# Patient Record
Sex: Male | Born: 1955 | Race: White | Hispanic: No | Marital: Single | Smoking: Former smoker
Health system: Southern US, Community
[De-identification: ages and names within clinical notes are randomized; demographics above are authoritative.]

## PROBLEM LIST (undated history)

## (undated) DIAGNOSIS — R569 Unspecified convulsions: Secondary | ICD-10-CM

## (undated) DIAGNOSIS — I10 Essential (primary) hypertension: Secondary | ICD-10-CM

## (undated) DIAGNOSIS — F10239 Alcohol dependence with withdrawal, unspecified: Secondary | ICD-10-CM

## (undated) DIAGNOSIS — F1010 Alcohol abuse, uncomplicated: Secondary | ICD-10-CM

## (undated) DIAGNOSIS — G4730 Sleep apnea, unspecified: Secondary | ICD-10-CM

## (undated) HISTORY — PX: KIDNEY SURGERY: SHX687

## (undated) HISTORY — PX: SPLENECTOMY: SUR1306

---

## 2016-11-20 ENCOUNTER — Encounter (HOSPITAL_COMMUNITY): Payer: Self-pay | Admitting: *Deleted

## 2016-11-20 ENCOUNTER — Emergency Department (HOSPITAL_COMMUNITY): Payer: No Typology Code available for payment source

## 2016-11-20 ENCOUNTER — Observation Stay (HOSPITAL_COMMUNITY)
Admission: EM | Admit: 2016-11-20 | Discharge: 2016-11-22 | Disposition: A | Discharge status: Home / Self Care | Payer: No Typology Code available for payment source | Attending: Internal Medicine | Admitting: Internal Medicine

## 2016-11-20 DIAGNOSIS — F10239 Alcohol dependence with withdrawal, unspecified: Secondary | ICD-10-CM | POA: Diagnosis not present

## 2016-11-20 DIAGNOSIS — I5031 Acute diastolic (congestive) heart failure: Secondary | ICD-10-CM | POA: Insufficient documentation

## 2016-11-20 DIAGNOSIS — R410 Disorientation, unspecified: Secondary | ICD-10-CM | POA: Diagnosis present

## 2016-11-20 DIAGNOSIS — Z789 Other specified health status: Secondary | ICD-10-CM | POA: Diagnosis present

## 2016-11-20 DIAGNOSIS — G4733 Obstructive sleep apnea (adult) (pediatric): Secondary | ICD-10-CM | POA: Diagnosis present

## 2016-11-20 DIAGNOSIS — Z9119 Patient's noncompliance with other medical treatment and regimen: Secondary | ICD-10-CM | POA: Diagnosis not present

## 2016-11-20 DIAGNOSIS — R55 Syncope and collapse: Principal | ICD-10-CM | POA: Insufficient documentation

## 2016-11-20 DIAGNOSIS — D72828 Other elevated white blood cell count: Secondary | ICD-10-CM | POA: Insufficient documentation

## 2016-11-20 DIAGNOSIS — F1010 Alcohol abuse, uncomplicated: Secondary | ICD-10-CM | POA: Diagnosis present

## 2016-11-20 DIAGNOSIS — F1090 Alcohol use, unspecified, uncomplicated: Secondary | ICD-10-CM | POA: Diagnosis present

## 2016-11-20 DIAGNOSIS — R413 Other amnesia: Secondary | ICD-10-CM | POA: Diagnosis not present

## 2016-11-20 DIAGNOSIS — D72829 Elevated white blood cell count, unspecified: Secondary | ICD-10-CM | POA: Diagnosis present

## 2016-11-20 DIAGNOSIS — I160 Hypertensive urgency: Secondary | ICD-10-CM | POA: Diagnosis not present

## 2016-11-20 DIAGNOSIS — I110 Hypertensive heart disease with heart failure: Secondary | ICD-10-CM | POA: Diagnosis not present

## 2016-11-20 DIAGNOSIS — I10 Essential (primary) hypertension: Secondary | ICD-10-CM | POA: Diagnosis not present

## 2016-11-20 DIAGNOSIS — Z7289 Other problems related to lifestyle: Secondary | ICD-10-CM | POA: Diagnosis present

## 2016-11-20 DIAGNOSIS — E876 Hypokalemia: Secondary | ICD-10-CM | POA: Insufficient documentation

## 2016-11-20 DIAGNOSIS — Z9114 Patient's other noncompliance with medication regimen: Secondary | ICD-10-CM | POA: Diagnosis not present

## 2016-11-20 DIAGNOSIS — F10939 Alcohol use, unspecified with withdrawal, unspecified: Secondary | ICD-10-CM

## 2016-11-20 DIAGNOSIS — R569 Unspecified convulsions: Secondary | ICD-10-CM

## 2016-11-20 DIAGNOSIS — R4020 Unspecified coma: Secondary | ICD-10-CM | POA: Diagnosis present

## 2016-11-20 HISTORY — DX: Sleep apnea, unspecified: G47.30

## 2016-11-20 HISTORY — DX: Essential (primary) hypertension: I10

## 2016-11-20 HISTORY — DX: Alcohol abuse, uncomplicated: F10.10

## 2016-11-20 HISTORY — DX: Unspecified convulsions: R56.9

## 2016-11-20 HISTORY — DX: Alcohol dependence with withdrawal, unspecified: F10.239

## 2016-11-20 LAB — CBC WITH DIFFERENTIAL/PLATELET
BASOS ABS: 0 10*3/uL (ref 0.0–0.1)
BASOS PCT: 0 %
EOS PCT: 2 %
Eosinophils Absolute: 0.3 10*3/uL (ref 0.0–0.7)
HCT: 45.2 % (ref 39.0–52.0)
Hemoglobin: 15.5 g/dL (ref 13.0–17.0)
Lymphocytes Relative: 29 %
Lymphs Abs: 4.1 10*3/uL — ABNORMAL HIGH (ref 0.7–4.0)
MCH: 32 pg (ref 26.0–34.0)
MCHC: 34.3 g/dL (ref 30.0–36.0)
MCV: 93.2 fL (ref 78.0–100.0)
MONO ABS: 1.6 10*3/uL — AB (ref 0.1–1.0)
Monocytes Relative: 11 %
Neutro Abs: 8.3 10*3/uL — ABNORMAL HIGH (ref 1.7–7.7)
Neutrophils Relative %: 58 %
PLATELETS: 353 10*3/uL (ref 150–400)
RBC: 4.85 MIL/uL (ref 4.22–5.81)
RDW: 13.6 % (ref 11.5–15.5)
WBC: 14.2 10*3/uL — ABNORMAL HIGH (ref 4.0–10.5)

## 2016-11-20 LAB — BASIC METABOLIC PANEL
ANION GAP: 10 (ref 5–15)
BUN: 13 mg/dL (ref 6–20)
CALCIUM: 8.7 mg/dL — AB (ref 8.9–10.3)
CO2: 23 mmol/L (ref 22–32)
Chloride: 103 mmol/L (ref 101–111)
Creatinine, Ser: 1.05 mg/dL (ref 0.61–1.24)
Glucose, Bld: 117 mg/dL — ABNORMAL HIGH (ref 65–99)
Potassium: 3.3 mmol/L — ABNORMAL LOW (ref 3.5–5.1)
SODIUM: 136 mmol/L (ref 135–145)

## 2016-11-20 MED ORDER — HYDRALAZINE HCL 20 MG/ML IJ SOLN
5.0000 mg | Freq: Once | INTRAMUSCULAR | Status: AC
  Administered 2016-11-20: 5 mg via INTRAVENOUS
  Filled 2016-11-20: qty 1

## 2016-11-20 MED ORDER — LORAZEPAM 1 MG PO TABS
0.0000 mg | ORAL_TABLET | Freq: Two times a day (BID) | ORAL | Status: DC
Start: 2016-11-23 — End: 2016-11-21

## 2016-11-20 MED ORDER — THIAMINE HCL 100 MG/ML IJ SOLN: 100.0000 mg | Freq: Every day | INTRAMUSCULAR | Status: DC

## 2016-11-20 MED ORDER — LORAZEPAM 2 MG/ML IJ SOLN: 0.0000 mg | Freq: Four times a day (QID) | INTRAMUSCULAR | Status: DC

## 2016-11-20 MED ORDER — LORAZEPAM 2 MG/ML IJ SOLN: 0.0000 mg | Freq: Two times a day (BID) | INTRAMUSCULAR | Status: DC

## 2016-11-20 MED ORDER — LORAZEPAM 1 MG PO TABS
0.0000 mg | ORAL_TABLET | Freq: Four times a day (QID) | ORAL | Status: DC
  Administered 2016-11-21: 1 mg via ORAL
  Filled 2016-11-20: qty 1

## 2016-11-20 MED ORDER — VITAMIN B-1 100 MG PO TABS: 100.0000 mg | ORAL_TABLET | Freq: Every day | ORAL | Status: DC
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
## 2016-11-21 ENCOUNTER — Encounter (HOSPITAL_COMMUNITY): Payer: Self-pay | Admitting: Family Medicine

## 2016-11-21 ENCOUNTER — Observation Stay (HOSPITAL_COMMUNITY): Payer: No Typology Code available for payment source

## 2016-11-21 ENCOUNTER — Observation Stay (HOSPITAL_COMMUNITY)
Admit: 2016-11-21 | Discharge: 2016-11-21 | Disposition: A | Discharge status: Home / Self Care | Payer: No Typology Code available for payment source | Attending: Family Medicine | Admitting: Family Medicine

## 2016-11-21 ENCOUNTER — Observation Stay (HOSPITAL_BASED_OUTPATIENT_CLINIC_OR_DEPARTMENT_OTHER): Payer: No Typology Code available for payment source

## 2016-11-21 DIAGNOSIS — G9340 Encephalopathy, unspecified: Secondary | ICD-10-CM | POA: Insufficient documentation

## 2016-11-21 DIAGNOSIS — Z7289 Other problems related to lifestyle: Secondary | ICD-10-CM | POA: Diagnosis present

## 2016-11-21 DIAGNOSIS — D72829 Elevated white blood cell count, unspecified: Secondary | ICD-10-CM | POA: Diagnosis present

## 2016-11-21 DIAGNOSIS — R410 Disorientation, unspecified: Secondary | ICD-10-CM

## 2016-11-21 DIAGNOSIS — R413 Other amnesia: Secondary | ICD-10-CM

## 2016-11-21 DIAGNOSIS — I160 Hypertensive urgency: Secondary | ICD-10-CM | POA: Diagnosis present

## 2016-11-21 DIAGNOSIS — I10 Essential (primary) hypertension: Secondary | ICD-10-CM

## 2016-11-21 DIAGNOSIS — G4733 Obstructive sleep apnea (adult) (pediatric): Secondary | ICD-10-CM

## 2016-11-21 DIAGNOSIS — Z789 Other specified health status: Secondary | ICD-10-CM

## 2016-11-21 DIAGNOSIS — I5032 Chronic diastolic (congestive) heart failure: Secondary | ICD-10-CM

## 2016-11-21 LAB — RAPID URINE DRUG SCREEN, HOSP PERFORMED
Amphetamines: NOT DETECTED
BARBITURATES: NOT DETECTED
Benzodiazepines: NOT DETECTED
Cocaine: NOT DETECTED
Opiates: NOT DETECTED
Tetrahydrocannabinol: POSITIVE — AB

## 2016-11-21 LAB — BLOOD GAS, ARTERIAL
Acid-Base Excess: 0 mmol/L (ref 0.0–2.0)
Bicarbonate: 24.4 mmol/L (ref 20.0–28.0)
Drawn by: 382351
O2 Saturation: 95.4 %
Patient temperature: 37
pCO2 arterial: 39.9 mmHg (ref 32.0–48.0)
pH, Arterial: 7.4 (ref 7.350–7.450)
pO2, Arterial: 79.2 mmHg — ABNORMAL LOW (ref 83.0–108.0)

## 2016-11-21 LAB — BASIC METABOLIC PANEL WITH GFR
Anion gap: 9 (ref 5–15)
BUN: 11 mg/dL (ref 6–20)
CO2: 24 mmol/L (ref 22–32)
Calcium: 8.8 mg/dL — ABNORMAL LOW (ref 8.9–10.3)
Chloride: 104 mmol/L (ref 101–111)
Creatinine, Ser: 0.92 mg/dL (ref 0.61–1.24)
GFR calc Af Amer: >60 mL/min
GFR calc non Af Amer: >60 mL/min
Glucose, Bld: 116 mg/dL — ABNORMAL HIGH (ref 65–99)
Potassium: 4.2 mmol/L (ref 3.5–5.1)
Sodium: 137 mmol/L (ref 135–145)

## 2016-11-21 LAB — URINALYSIS, ROUTINE W REFLEX MICROSCOPIC
BILIRUBIN URINE: NEGATIVE
Bacteria, UA: NONE SEEN
GLUCOSE, UA: NEGATIVE mg/dL
HGB URINE DIPSTICK: NEGATIVE
KETONES UR: 5 mg/dL — AB
LEUKOCYTES UA: NEGATIVE
NITRITE: NEGATIVE
PROTEIN: 100 mg/dL — AB
RBC / HPF: NONE SEEN RBC/hpf (ref 0–5)
Specific Gravity, Urine: 1.016 (ref 1.005–1.030)
pH: 6 (ref 5.0–8.0)

## 2016-11-21 LAB — CBC
HCT: 45.9 % (ref 39.0–52.0)
Hemoglobin: 15.4 g/dL (ref 13.0–17.0)
MCH: 31.3 pg (ref 26.0–34.0)
MCHC: 33.6 g/dL (ref 30.0–36.0)
MCV: 93.3 fL (ref 78.0–100.0)
PLATELETS: 368 10*3/uL (ref 150–400)
RBC: 4.92 MIL/uL (ref 4.22–5.81)
RDW: 13.6 % (ref 11.5–15.5)
WBC: 13.7 10*3/uL — AB (ref 4.0–10.5)

## 2016-11-21 LAB — ETHANOL: Alcohol, Ethyl (B): <5 mg/dL (ref <5)

## 2016-11-21 LAB — MAGNESIUM: Magnesium: 1.8 mg/dL (ref 1.7–2.4)

## 2016-11-21 LAB — VITAMIN B12: Vitamin B-12: 524 pg/mL (ref 180–914)

## 2016-11-21 LAB — TSH: TSH: 1.371 u[IU]/mL (ref 0.350–4.500)

## 2016-11-21 LAB — MRSA PCR SCREENING: MRSA by PCR: NEGATIVE

## 2016-11-21 MED ORDER — THIAMINE HCL 100 MG/ML IJ SOLN: 100.0000 mg | Freq: Every day | INTRAMUSCULAR | Status: DC

## 2016-11-21 MED ORDER — LORAZEPAM 1 MG PO TABS
1.0000 mg | ORAL_TABLET | Freq: Four times a day (QID) | ORAL | Status: DC | PRN
  Administered 2016-11-21: 1 mg via ORAL
  Filled 2016-11-21: qty 1

## 2016-11-21 MED ORDER — SODIUM CHLORIDE 0.9% FLUSH
3.0000 mL | Freq: Two times a day (BID) | INTRAVENOUS | Status: DC
  Administered 2016-11-21 – 2016-11-22 (×2): 3 mL via INTRAVENOUS

## 2016-11-21 MED ORDER — HYDRALAZINE HCL 10 MG PO TABS
10.0000 mg | ORAL_TABLET | Freq: Four times a day (QID) | ORAL | Status: DC
  Administered 2016-11-21 – 2016-11-22 (×6): 10 mg via ORAL
  Filled 2016-11-21 (×10): qty 1

## 2016-11-21 MED ORDER — THIAMINE HCL 100 MG/ML IJ SOLN
INTRAMUSCULAR | Status: AC
  Filled 2016-11-21: qty 2

## 2016-11-21 MED ORDER — FOLIC ACID 1 MG PO TABS
1.0000 mg | ORAL_TABLET | Freq: Every day | ORAL | Status: DC
  Administered 2016-11-21 – 2016-11-22 (×2): 1 mg via ORAL
  Filled 2016-11-21 (×2): qty 1

## 2016-11-21 MED ORDER — HYDRALAZINE HCL 20 MG/ML IJ SOLN: 10.0000 mg | INTRAMUSCULAR | Status: DC | PRN

## 2016-11-21 MED ORDER — THIAMINE HCL 100 MG/ML IJ SOLN
Freq: Once | INTRAVENOUS | Status: AC
  Administered 2016-11-21: 05:00:00 via INTRAVENOUS
  Filled 2016-11-21: qty 1000

## 2016-11-21 MED ORDER — SODIUM CHLORIDE 0.9% FLUSH: 3.0000 mL | INTRAVENOUS | Status: DC | PRN

## 2016-11-21 MED ORDER — M.V.I. ADULT IV INJ
INJECTION | INTRAVENOUS | Status: AC
  Filled 2016-11-21: qty 10

## 2016-11-21 MED ORDER — AMLODIPINE BESYLATE 5 MG PO TABS
5.0000 mg | ORAL_TABLET | Freq: Every day | ORAL | Status: DC
  Administered 2016-11-21 – 2016-11-22 (×2): 5 mg via ORAL
  Filled 2016-11-21 (×2): qty 1

## 2016-11-21 MED ORDER — LORAZEPAM 1 MG PO TABS
0.0000 mg | ORAL_TABLET | Freq: Four times a day (QID) | ORAL | Status: DC
  Administered 2016-11-21 – 2016-11-22 (×2): 1 mg via ORAL
  Filled 2016-11-21: qty 1
  Filled 2016-11-21: qty 2

## 2016-11-21 MED ORDER — SODIUM CHLORIDE 0.9 % IV SOLN: 250.0000 mL | INTRAVENOUS | Status: DC | PRN

## 2016-11-21 MED ORDER — LORAZEPAM 2 MG/ML IJ SOLN: 1.0000 mg | Freq: Four times a day (QID) | INTRAMUSCULAR | Status: DC | PRN

## 2016-11-21 MED ORDER — HYDRALAZINE HCL 20 MG/ML IJ SOLN
20.0000 mg | INTRAMUSCULAR | Status: DC | PRN
Start: 2016-11-21 — End: 2016-11-21
  Administered 2016-11-21: 20 mg via INTRAVENOUS
  Filled 2016-11-21: qty 1

## 2016-11-21 MED ORDER — PNEUMOCOCCAL VAC POLYVALENT 25 MCG/0.5ML IJ INJ
0.5000 mL | INJECTION | INTRAMUSCULAR | Status: AC
  Administered 2016-11-22: 0.5 mL via INTRAMUSCULAR
  Filled 2016-11-21: qty 0.5

## 2016-11-21 MED ORDER — ADULT MULTIVITAMIN W/MINERALS CH
1.0000 | ORAL_TABLET | Freq: Every day | ORAL | Status: DC
  Administered 2016-11-21 – 2016-11-22 (×2): 1 via ORAL
  Filled 2016-11-21 (×2): qty 1

## 2016-11-21 MED ORDER — FOLIC ACID 5 MG/ML IJ SOLN
INTRAMUSCULAR | Status: AC
  Filled 2016-11-21: qty 0.2

## 2016-11-21 MED ORDER — VITAMIN B-1 100 MG PO TABS
100.0000 mg | ORAL_TABLET | Freq: Every day | ORAL | Status: DC
  Administered 2016-11-21 – 2016-11-22 (×2): 100 mg via ORAL
  Filled 2016-11-21 (×2): qty 1

## 2016-11-21 MED ORDER — LORAZEPAM 1 MG PO TABS: 0.0000 mg | ORAL_TABLET | Freq: Two times a day (BID) | ORAL | Status: DC

## 2016-11-21 NOTE — Progress Notes (Signed)
Was the fall witnessed: No  Patient condition before and after the fall: Patient was alert and oriented before fall and is alert and oriented after fall  Patient's reaction to the fall:  Patient apologetic for not using call light to get help to urinate. Patient states he is fine and has no injuries  Name of the doctor that was notified including date and time: Dr. Elliot Cousinenise Fisher 11/21/2016 0710  Any interventions and vital signs: Patient helped back to bed. Fall mats placed; bed alarm on; VS stable; patient advised to wear yellow slip proof socks.  Patient states he was trying to let side rail down so he could use his urinal. He states that he had all body weight on the rail, and when the rail went down he went down with it. Patient has abrasion to left knee, but states there is no pain.  All side rails up due to seizure precautions and seizure pads in place.
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
## 2016-11-21 NOTE — Progress Notes (Signed)
[facility] Spoke with Nathan Barajas and reported new neurology consult for Dr.[place], she said she would let him know.

## 2016-11-21 NOTE — Progress Notes (Signed)
EEG completed, results pending. 

## 2016-11-21 NOTE — Procedures (Signed)
  [facility] Micayla Brathwaite A. Gerilyn Pilgrimoonquah, MD     [email or web address]           HISTORY: The patient is a 61 year old man who presents with a spell of confusion and syncope worrisome for unwitnessed seizure.  MEDICATIONS: Scheduled Meds: . amLODipine  5 mg Oral Daily  . folic acid  1 mg Oral Daily  . hydrALAZINE  10 mg Oral Q6H  . LORazepam  0-4 mg Oral Q6H   Followed by  . [START ON 11/23/2016] LORazepam  0-4 mg Oral Q12H  . multivitamin with minerals  1 tablet Oral Daily  . [START ON 11/22/2016] pneumococcal 23 valent vaccine  0.5 mL Intramuscular Tomorrow-1000  . sodium chloride flush  3 mL Intravenous Q12H  . thiamine  100 mg Oral Daily   Or  . thiamine  100 mg Intravenous Daily   Continuous Infusions: . sodium chloride     PRN Meds:.sodium chloride, hydrALAZINE, LORazepam **OR** LORazepam, sodium chloride flush  Prior to Admission medications   Not on File      ANALYSIS: A 16 channel recording using standard 10 20 measurements is conducted for 20 minutes. There is a well-formed posterior dominant rhythm of 10-10 0.5 Hz which attenuates with eye opening. There is mostly awake and drowsy activities observed. There is a brief episodes of spindles and K complexes. Photic simulation is carried out without abnormal changes in the background activity. Hyperventilation is not carried out. There is no focal or lateral slowing. There is no epileptiform activities observed.  IMPRESSION: This is a normal recording of the awake and drowsy states.      Miniya Miguez A. Gerilyn Pilgrimoonquah, M.D.  Diplomate, Biomedical engineerAmerican Board of Psychiatry and Neurology ( Neurology).

## 2016-11-21 NOTE — Progress Notes (Signed)
Pt placed on [facility] CPAP. CPAP plugged into red outlet with 2L O2 in line per MD order. Pt tolerating well. Settings per pt per home setting.
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
## 2016-11-22 ENCOUNTER — Observation Stay (HOSPITAL_COMMUNITY): Payer: No Typology Code available for payment source

## 2016-11-22 ENCOUNTER — Encounter (HOSPITAL_COMMUNITY): Payer: Self-pay | Admitting: Internal Medicine

## 2016-11-22 DIAGNOSIS — I10 Essential (primary) hypertension: Secondary | ICD-10-CM

## 2016-11-22 DIAGNOSIS — F1010 Alcohol abuse, uncomplicated: Secondary | ICD-10-CM

## 2016-11-22 DIAGNOSIS — R569 Unspecified convulsions: Secondary | ICD-10-CM

## 2016-11-22 DIAGNOSIS — R4020 Unspecified coma: Secondary | ICD-10-CM | POA: Diagnosis present

## 2016-11-22 DIAGNOSIS — F10239 Alcohol dependence with withdrawal, unspecified: Secondary | ICD-10-CM

## 2016-11-22 DIAGNOSIS — F10939 Alcohol use, unspecified with withdrawal, unspecified: Secondary | ICD-10-CM

## 2016-11-22 HISTORY — DX: Alcohol abuse, uncomplicated: F10.10

## 2016-11-22 HISTORY — DX: Alcohol dependence with withdrawal, unspecified: F10.239

## 2016-11-22 HISTORY — DX: Alcohol use, unspecified with withdrawal, unspecified: F10.939

## 2016-11-22 HISTORY — DX: Unspecified convulsions: R56.9

## 2016-11-22 LAB — ECHOCARDIOGRAM COMPLETE
AOPV: 0.59 m/s
AV Area mean vel: 1.38 cm2
AV pk vel: 281 cm/s
AVAREAMEANVIN: 0.59 cm2/m2
AVAREAVTI: 1.34 cm2
AVAREAVTIIND: 0.66 cm2/m2
AVCELMEANRAT: 0.61
AVG: 15 mmHg
AVPG: 32 mmHg
CHL CUP AV PEAK INDEX: 0.57
CHL CUP AV VEL: 1.55
CHL CUP DOP CALC LVOT VTI: 36.6 cm
CHL CUP STROKE VOLUME: 64 mL
DOP CAL AO MEAN VELOCITY: 176 cm/s
E/e' ratio: 9.92
EWDT: 303 ms
FS: 42 % (ref 28–44)
Height: 68 in
IV/PV OW: 1.01
LA diam index: 1.71 cm/m2
LA vol A4C: 43.4 ml
LA vol index: 21.5 mL/m2
LA vol: 50.3 mL
LASIZE: 40 mm
LDCA: 2.27 cm2
LEFT ATRIUM END SYS DIAM: 40 mm
LV E/e'average: 9.92
LV PW d: 12 mm — AB (ref 0.6–1.1)
LV SIMPSON'S DISK: 68
LV TDI E'LATERAL: 9.68
LV dias vol index: 40 mL/m2
LV dias vol: 94 mL (ref 62–150)
LV e' LATERAL: 9.68 cm/s
LV sys vol: 31 mL
LVEEMED: 9.92
LVOT SV: 83 mL
LVOT peak VTI: 0.68 cm
LVOT peak grad rest: 11 mmHg
LVOTD: 17 mm
LVOTPV: 166 cm/s
LVSYSVOLIN: 13 mL/m2
MV Dec: 303
MVPG: 4 mmHg
MVPKAVEL: 123 m/s
MVPKEVEL: 96 m/s
RV LATERAL S' VELOCITY: 12.9 cm/s
TAPSE: 22.3 mm
TDI e' medial: 6.85
VTI: 53.6 cm
Valve area index: 0.66
Valve area: 1.55 cm2
Weight: 3865.99 oz

## 2016-11-22 LAB — COMPREHENSIVE METABOLIC PANEL
ALT: 19 U/L (ref 17–63)
AST: 18 U/L (ref 15–41)
Albumin: 3.6 g/dL (ref 3.5–5.0)
Alkaline Phosphatase: 75 U/L (ref 38–126)
Anion gap: 6 (ref 5–15)
BILIRUBIN TOTAL: 1 mg/dL (ref 0.3–1.2)
BUN: 12 mg/dL (ref 6–20)
CHLORIDE: 107 mmol/L (ref 101–111)
CO2: 23 mmol/L (ref 22–32)
CREATININE: 0.81 mg/dL (ref 0.61–1.24)
Calcium: 8.8 mg/dL — ABNORMAL LOW (ref 8.9–10.3)
GFR calc Af Amer: >60 mL/min (ref >60)
Glucose, Bld: 94 mg/dL (ref 65–99)
Potassium: 3.7 mmol/L (ref 3.5–5.1)
Sodium: 136 mmol/L (ref 135–145)
Total Protein: 7.2 g/dL (ref 6.5–8.1)

## 2016-11-22 LAB — CK TOTAL AND CKMB (NOT AT ARMC)
CK, MB: 1.8 ng/mL (ref 0.5–5.0)
Relative Index: INVALID (ref 0.0–2.5)
Total CK: 89 U/L (ref 49–397)

## 2016-11-22 LAB — CBC
HCT: 46.6 % (ref 39.0–52.0)
Hemoglobin: 15.5 g/dL (ref 13.0–17.0)
MCH: 31.3 pg (ref 26.0–34.0)
MCHC: 33.3 g/dL (ref 30.0–36.0)
MCV: 94 fL (ref 78.0–100.0)
PLATELETS: 371 10*3/uL (ref 150–400)
RBC: 4.96 MIL/uL (ref 4.22–5.81)
RDW: 14 % (ref 11.5–15.5)
WBC: 10.9 10*3/uL — AB (ref 4.0–10.5)

## 2016-11-22 LAB — HIV ANTIBODY (ROUTINE TESTING W REFLEX): HIV Screen 4th Generation wRfx: NONREACTIVE

## 2016-11-22 LAB — RPR: RPR: NONREACTIVE

## 2016-11-22 MED ORDER — AMLODIPINE BESYLATE 10 MG PO TABS: 10.0000 mg | ORAL_TABLET | Freq: Every day | ORAL | 1 refills | Status: AC

## 2016-11-22 MED ORDER — THIAMINE HCL 100 MG PO TABS: 100.0000 mg | ORAL_TABLET | Freq: Every day | ORAL | Status: AC

## 2016-11-22 MED ORDER — HYDRALAZINE HCL 25 MG PO TABS: 25.0000 mg | ORAL_TABLET | Freq: Two times a day (BID) | ORAL | 1 refills | Status: AC

## 2016-11-22 MED ORDER — ADULT MULTIVITAMIN W/MINERALS CH: 1.0000 | ORAL_TABLET | Freq: Every day | ORAL | Status: AC

## 2016-11-22 MED ORDER — GADOBENATE DIMEGLUMINE 529 MG/ML IV SOLN
20.0000 mL | Freq: Once | INTRAVENOUS | Status: AC | PRN
  Administered 2016-11-22: 20 mL via INTRAVENOUS

## 2016-11-22 MED ORDER — POTASSIUM CHLORIDE CRYS ER 20 MEQ PO TBCR
20.0000 meq | EXTENDED_RELEASE_TABLET | Freq: Every day | ORAL | Status: DC
  Administered 2016-11-22: 20 meq via ORAL
  Filled 2016-11-22: qty 1

## 2016-11-22 NOTE — Progress Notes (Signed)
*  PRELIMINARY RESULTS* Echocardiogram 2D Echocardiogram has been performed. Epic went down 11/21/16. Delay in report.  Stacey DrainWhite, Christopher Hink J 11/22/2016, 10:42 AM

## 2016-11-22 NOTE — ACP (Advance Care Planning) (Signed)
Gave Nathan Barajas information on [employer]chant navy officerAdvance Directives. He wants to review it before completing.
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
